# Patient Record
Sex: Male | Born: 1975 | Hispanic: No | Marital: Single
Health system: Southern US, Community
[De-identification: ages and names within clinical notes are randomized; demographics above are authoritative.]

---

## 2009-05-06 ENCOUNTER — Emergency Department (HOSPITAL_COMMUNITY): Admission: EM | Admit: 2009-05-06 | Discharge: 2009-05-06 | Payer: Self-pay | Admitting: Emergency Medicine

## 2009-08-29 ENCOUNTER — Emergency Department (HOSPITAL_COMMUNITY): Admission: EM | Admit: 2009-08-29 | Discharge: 2009-08-29 | Payer: Self-pay | Admitting: Emergency Medicine

## 2010-04-08 LAB — CBC
Hemoglobin: 14.4 g/dL (ref 13.0–17.0)
MCHC: 33.5 g/dL (ref 30.0–36.0)
RDW: 13.1 % (ref 11.5–15.5)

## 2010-04-08 LAB — DIFFERENTIAL
Basophils Relative: 1 % (ref 0–1)
Lymphocytes Relative: 13 % (ref 12–46)
Lymphs Abs: 1.2 10*3/uL (ref 0.7–4.0)
Monocytes Absolute: 0.6 10*3/uL (ref 0.1–1.0)
Monocytes Relative: 7 % (ref 3–12)
Neutrophils Relative %: 79 % — ABNORMAL HIGH (ref 43–77)

## 2010-04-08 LAB — BASIC METABOLIC PANEL
BUN: 20 mg/dL (ref 6–23)
CO2: 27 mEq/L (ref 19–32)
Calcium: 8.9 mg/dL (ref 8.4–10.5)
Chloride: 101 mEq/L (ref 96–112)
Creatinine, Ser: 1.02 mg/dL (ref 0.4–1.5)
GFR calc Af Amer: >60 mL/min (ref >60)
GFR calc non Af Amer: >60 mL/min (ref >60)
Glucose, Bld: 95 mg/dL (ref 70–99)
Sodium: 137 mEq/L (ref 135–145)

## 2010-04-08 LAB — URINALYSIS, ROUTINE W REFLEX MICROSCOPIC
Glucose, UA: NEGATIVE mg/dL
Urobilinogen, UA: 0.2 mg/dL (ref 0.0–1.0)
pH: 5.5 (ref 5.0–8.0)

## 2010-04-08 LAB — HEPATIC FUNCTION PANEL
AST: 32 U/L (ref 0–37)
Total Bilirubin: 1.5 mg/dL — ABNORMAL HIGH (ref 0.3–1.2)

## 2011-07-30 IMAGING — CT CT HEAD W/O CM
1 of 3 series · 15 of 30 positions shown, 19 images · non-contrast
Comparison: None.

CLINICAL DATA: Generalized weakness and weight loss for 2 months;
new onset of right zygomatic/parietal headache with photophobia.
Status post fall today.

CT HEAD WITHOUT CONTRAST
TECHNIQUE: Contiguous axial images were obtained from the base of
the skull through the vertex without contrast.

[Series 2: headseq 4.8 h45s · axial · 0.43mm/px · z∈[-142,-18]mm · 15 of 30 slices shown, 19 images]
[im 2/30  brain]
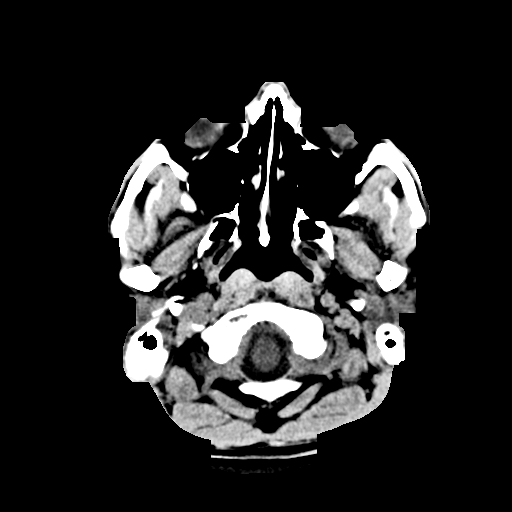
[im 2/30  bone]
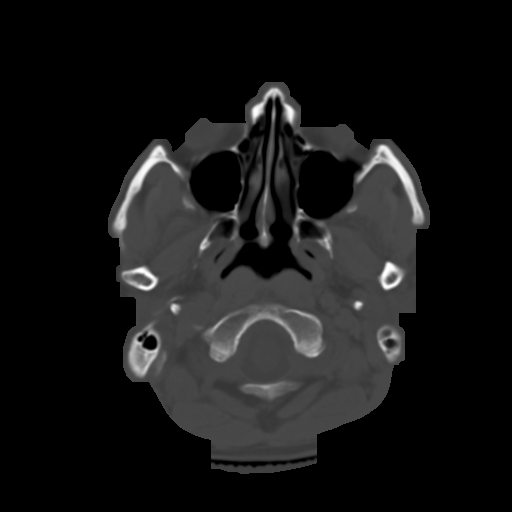
[im 4/30  brain]
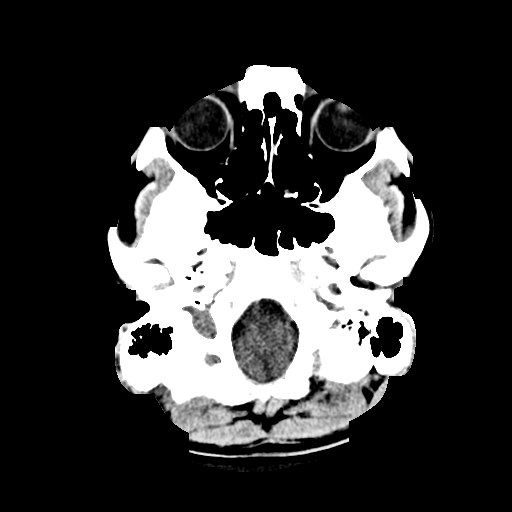
[im 6/30  brain]
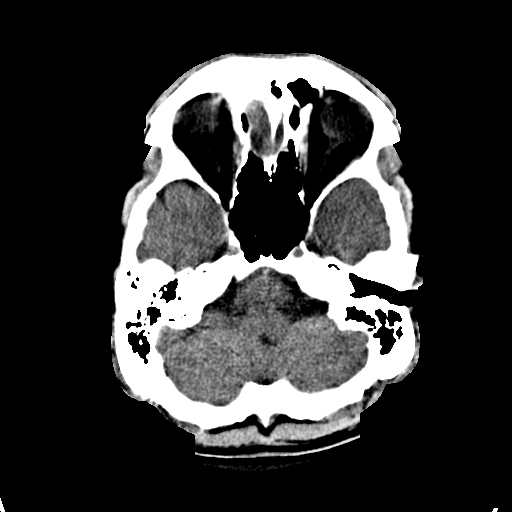
[im 7/30  brain]
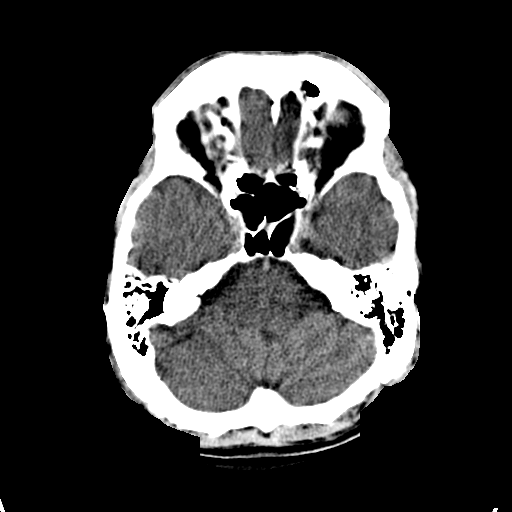
[im 9/30  brain]
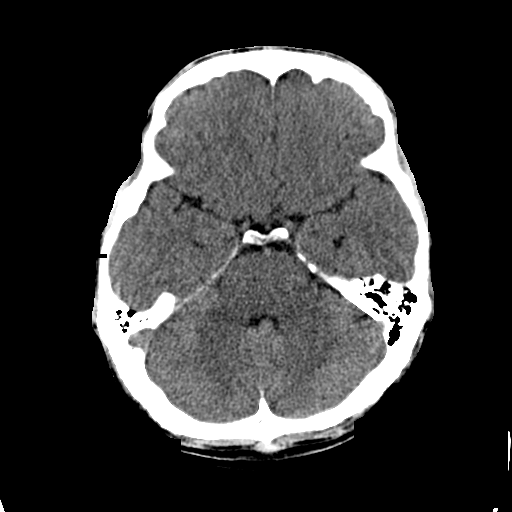
[im 9/30  bone]
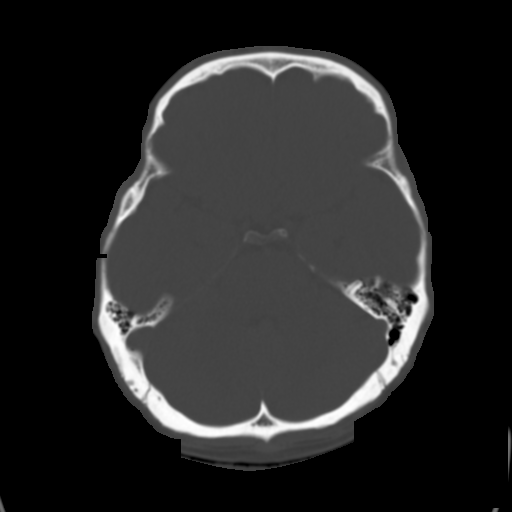
[im 11/30  brain]
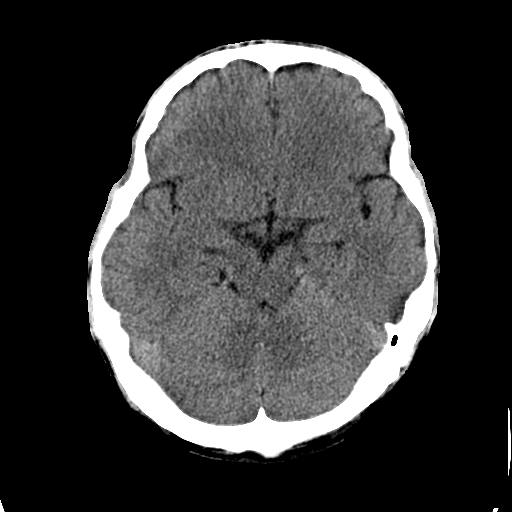
[im 12/30  brain]
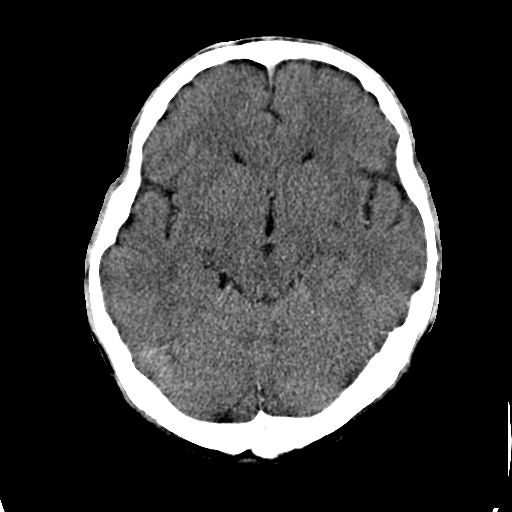
[im 16/30  brain]
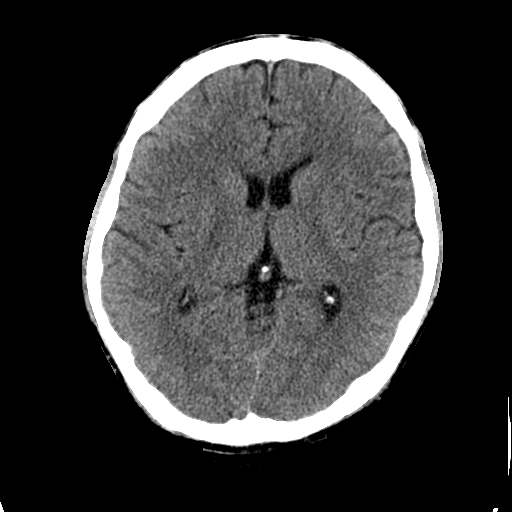
[im 18/30  brain]
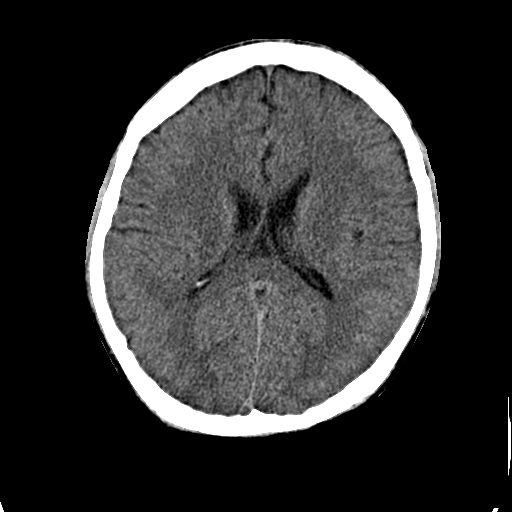
[im 18/30  bone]
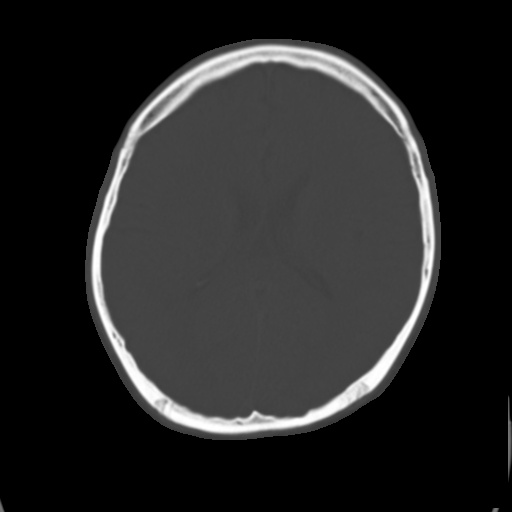
[im 19/30  brain]
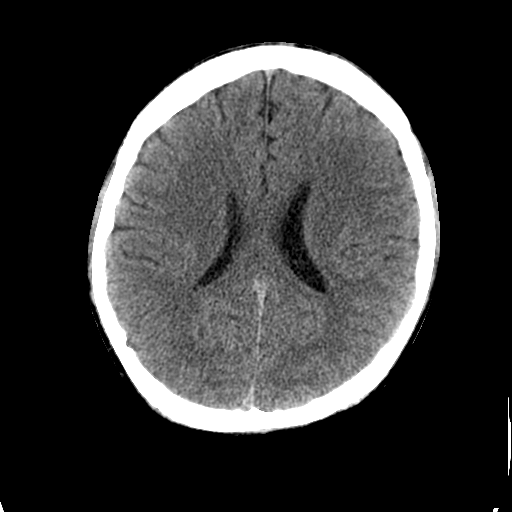
[im 21/30  brain]
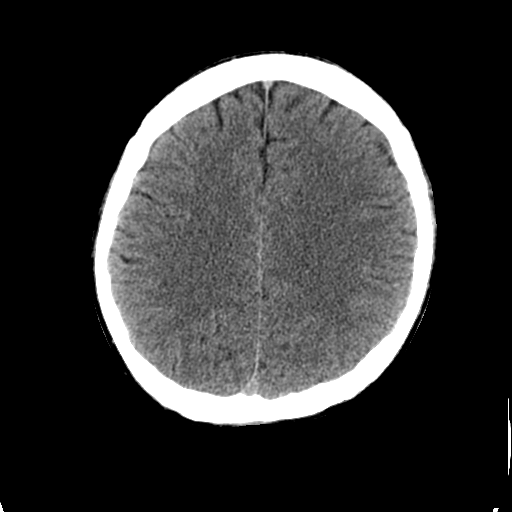
[im 23/30  brain]
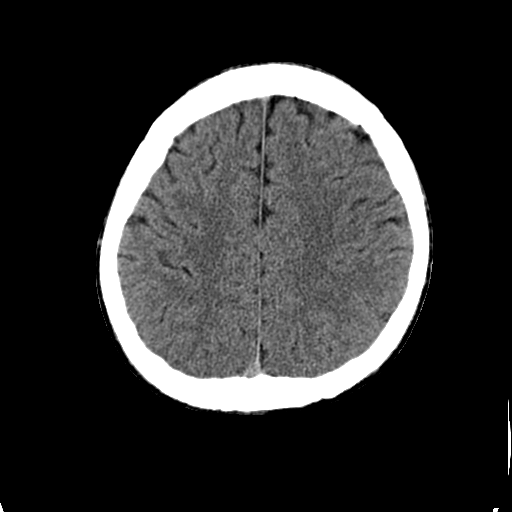
[im 24/30  brain]
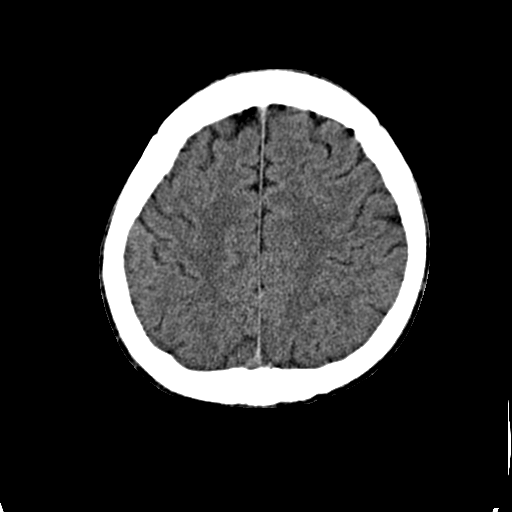
[im 24/30  bone]
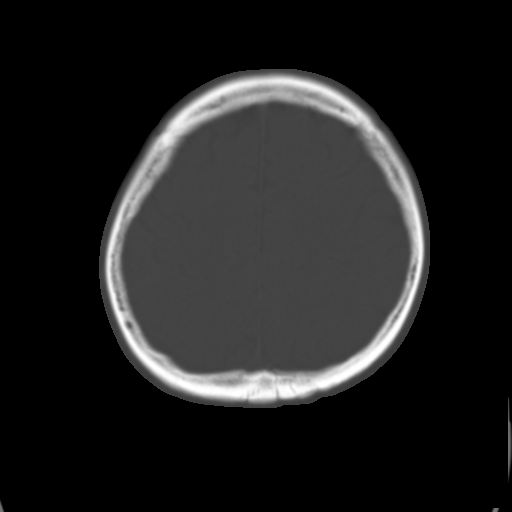
[im 26/30  brain]
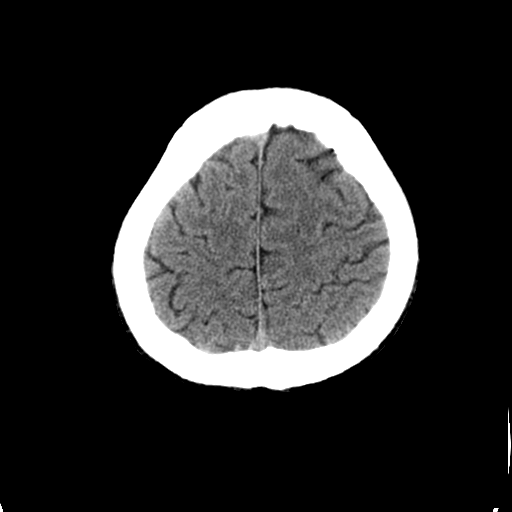
[im 28/30  brain]
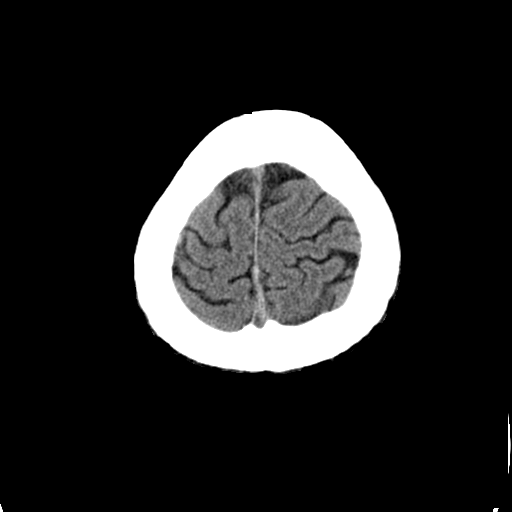

[15 of 30 positions shown; findings below may reference images not displayed]

FINDINGS: There is no evidence of acute infarction, mass lesion, or
intra- or extra-axial hemorrhage on CT.

The posterior fossa, including the cerebellum, brainstem and fourth
ventricle, is within normal limits.  The third and lateral
ventricles, and basal ganglia are unremarkable in appearance.  The
cerebral hemispheres are symmetric in appearance, with normal gray-
white differentiation.  No mass effect or midline shift is seen.

There is no evidence of fracture; visualized osseous structures are
unremarkable in appearance.  The visualized portions of the orbits
are within normal limits.  The paranasal sinuses and mastoid air
cells are well-aerated.  There is hypoplasia of the frontal
sinuses.  No significant soft tissue abnormalities are seen.
IMPRESSION: No evidence of traumatic intracranial injury or fracture.

## 2021-08-27 ENCOUNTER — Emergency Department (HOSPITAL_COMMUNITY): Payer: 59

## 2021-08-27 ENCOUNTER — Encounter (HOSPITAL_COMMUNITY): Payer: Self-pay | Admitting: Emergency Medicine

## 2021-08-27 ENCOUNTER — Emergency Department (HOSPITAL_COMMUNITY)
Admission: EM | Admit: 2021-08-27 | Discharge: 2021-08-27 | Disposition: A | Discharge status: Home / Self Care | Payer: 59 | Attending: Emergency Medicine | Admitting: Emergency Medicine

## 2021-08-27 DIAGNOSIS — R519 Headache, unspecified: Secondary | ICD-10-CM | POA: Diagnosis present

## 2021-08-27 LAB — CBC
HCT: 47 % (ref 39.0–52.0)
Hemoglobin: 15.4 g/dL (ref 13.0–17.0)
MCH: 30.9 pg (ref 26.0–34.0)
MCHC: 32.8 g/dL (ref 30.0–36.0)
MCV: 94.2 fL (ref 80.0–100.0)
Platelets: 282 10*3/uL (ref 150–400)
RBC: 4.99 MIL/uL (ref 4.22–5.81)
RDW: 12.8 % (ref 11.5–15.5)
WBC: 5.1 10*3/uL (ref 4.0–10.5)
nRBC: 0 % (ref 0.0–0.2)

## 2021-08-27 LAB — BASIC METABOLIC PANEL
Anion gap: 8 (ref 5–15)
BUN: 12 mg/dL (ref 6–20)
CO2: 22 mmol/L (ref 22–32)
Calcium: 8.9 mg/dL (ref 8.9–10.3)
Chloride: 106 mmol/L (ref 98–111)
Creatinine, Ser: 0.83 mg/dL (ref 0.61–1.24)
GFR, Estimated: >60 mL/min (ref >60)
Glucose, Bld: 93 mg/dL (ref 70–99)
Potassium: 3.7 mmol/L (ref 3.5–5.1)
Sodium: 136 mmol/L (ref 135–145)

## 2021-08-27 NOTE — ED Triage Notes (Signed)
Pt endorses pain to right back side of head since 5 am. No relief from tylenol. Ambulatory to triage, no facial droop.

## 2021-08-27 NOTE — Discharge Instructions (Signed)
S,Please use Tylenol or ibuprofen for pain.  You may use 600 mg ibuprofen every 6 hours or 1000 mg of Tylenol every 6 hours.  You may choose to alternate between the 2.  This would be most effective.  Not to exceed 4 g of Tylenol within 24 hours.  Not to exceed 3200 mg ibuprofen 24 hours.  Please continue to drink plenty of fluids, and monitor symptoms, if you begin to have vision changes, double vision, nausea, vomiting, weakness, dizziness, tingling please return for further evaluation.

## 2021-08-27 NOTE — ED Notes (Signed)
Patient given discharge instructions, all questions answered. Patient in possession of all belongings, directed to the discharge area  

## 2021-08-27 NOTE — ED Provider Triage Note (Signed)
Emergency Medicine Provider Triage Evaluation Note  Javier Lewis , a 46 y.o. male  was evaluated in triage.  Pt complains of headache that started suddenly this morning. Endorses largely on right side of head, started spontaneously. Minimal relief with tylenol. Denies photophobia, nausea, vomiting, weakness, numbness, does endorse mild dizziness.  Review of Systems  Positive: headache Negative: Photophobia, NV  Physical Exam  BP 130/77 (BP Location: Left Arm)   Pulse 85   Temp 97.6 F (36.4 C) (Oral)   Resp 16   Ht 5\' 3"  (1.6 m)   Wt 49.9 kg   SpO2 100%   BMI 19.49 kg/m  Gen:   Awake, no distress   Resp:  Normal effort  MSK:   Moves extremities without difficulty  Other:  Moves all limbs spontaneously, CN2-12 grossly intact  Medical Decision Making  Medically screening exam initiated at 12:08 PM.  Appropriate orders placed.  Javier Lewis was informed that the remainder of the evaluation will be completed by another provider, this initial triage assessment does not replace that evaluation, and the importance of remaining in the ED until their evaluation is complete.  Workup initiated   Arlana Pouch, PA-C 08/27/21 1210

## 2021-08-27 NOTE — ED Provider Notes (Signed)
[facility]-EMERGENCY DEPT Provider Note   CSN: [id number] Arrival date & time: 08/27/21  1151     History  Chief Complaint  Patient presents with   Headache    Javier Lewis is a 46 y.o. male Pt complains of headache that started suddenly this morning. Endorses largely on right side of head, started spontaneously. Minimal relief with tylenol. Denies photophobia, nausea, vomiting, weakness, numbness, does endorse mild dizziness.  Reports never experienced something like this before.  No history of tension type headaches, migraines, cluster headaches.  Patient reports that he is normally healthy, runs every day, works out.  Patient reports that he was scared due to the severity of onset of the headache.   Headache      Home Medications Prior to Admission medications   Not on File      Allergies    Patient has no allergy information on record.    Review of Systems   Review of Systems  Neurological:  Positive for headaches.  All other systems reviewed and are negative.   Physical Exam Updated Vital Signs BP 130/77 (BP Location: Left Arm)   Pulse 85   Temp 97.6 F (36.4 C) (Oral)   Resp 16   Ht 5\' 3"  (1.6 m)   Wt 49.9 kg   SpO2 100%   BMI 19.49 kg/m  Physical Exam Vitals and nursing note reviewed.  Constitutional:      General: He is not in acute distress.    Appearance: Normal appearance.  HENT:     Head: Normocephalic and atraumatic.  Eyes:     General:        Right eye: No discharge.        Left eye: No discharge.  Cardiovascular:     Rate and Rhythm: Normal rate and regular rhythm.     Heart sounds: No murmur heard.    No friction rub. No gallop.  Pulmonary:     Effort: Pulmonary effort is normal.     Breath sounds: Normal breath sounds.  Abdominal:     General: Bowel sounds are normal.     Palpations: Abdomen is soft.  Skin:    General: Skin is warm and dry.     Capillary Refill: Capillary refill takes less than 2 seconds.   Neurological:     Mental Status: He is alert and oriented to person, place, and time.     Comments: Cranial nerves II through XII grossly intact.  Intact finger-nose, intact heel-to-shin.  Romberg negative, gait normal.  Alert and oriented x3.  Moves all 4 limbs spontaneously, normal coordination.  No pronator drift.  Intact strength 5 out of 5 bilateral upper and lower extremities.    Psychiatric:        Mood and Affect: Mood normal.        Behavior: Behavior normal.     ED Results / Procedures / Treatments   Labs (all labs ordered are listed, but only abnormal results are displayed) Labs Reviewed  CBC  BASIC METABOLIC PANEL    EKG None  Radiology CT Head Wo Contrast  Result Date: 08/27/2021 CLINICAL DATA:  Sudden onset headache. EXAM: CT HEAD WITHOUT CONTRAST TECHNIQUE: Contiguous axial images were obtained from the base of the skull through the vertex without intravenous contrast. RADIATION DOSE REDUCTION: This exam was performed according to the departmental dose-optimization program which includes automated exposure control, adjustment of the mA and/or kV according to patient size and/or use of iterative reconstruction technique.  COMPARISON:  08/29/2009 FINDINGS: Brain: There is no evidence for acute hemorrhage, hydrocephalus, mass lesion, or abnormal extra-axial fluid collection. No definite CT evidence for acute infarction. Vascular: No hyperdense vessel or unexpected calcification. Skull: No evidence for fracture. No worrisome lytic or sclerotic lesion. Sinuses/Orbits: The visualized paranasal sinuses and mastoid air cells are clear. Visualized portions of the globes and intraorbital fat are unremarkable. Other: None. IMPRESSION: No acute intracranial abnormality. Electronically Signed   By: Kennith Center M.D.   On: 08/27/2021 12:42    Procedures Procedures    Medications Ordered in ED Medications - No data to display  ED Course/ Medical Decision Making/ A&P                            Medical Decision Making Amount and/or Complexity of Data Reviewed Labs: ordered. Radiology: ordered.   This patient is a 46 y.o. male who presents to the ED for concern of sudden onset right-sided headache, this involves an extensive number of treatment options, and is a complaint that carries with it a high risk of complications and morbidity. The emergent differential diagnosis prior to evaluation includes, but is not limited to, atypical stroke manifestation, subarachnoid hemorrhage, migraine, ocular migraine, cluster headache, and lateral tension type headache,.   This is not an exhaustive differential.   Past Medical History / Co-morbidities / Social History: Noncontributory  Additional history: Chart reviewed. Pertinent results include: No previous past medical history on file  Physical Exam: Physical exam performed. The pertinent findings include: No focal neurologic deficits on exam, negative Romberg, normal gait.  No evidence of nystagmus or peripheral cause of vertigo.  Lab Tests: I ordered, and personally interpreted labs.  The pertinent results include: CBC and BMP are unremarkable   Imaging Studies: I ordered imaging studies including CT head without contrast. I independently visualized and interpreted imaging which showed no intracranial abnormality. I agree with the radiologist interpretation.  Disposition: After consideration of the diagnostic results and the patients response to treatment, I feel that patient's symptoms have resolved/improved over time with watchful waiting.  Encouraged continued ibuprofen, Tylenol, plain fluids, and follow-up as needed with any evolution of symptoms.  At this time I have low clinical suspicion for acute intracranial abnormality, suspicious of migraine versus cluster headache versus tension type headache which is not intractable..   emergency department workup does not suggest an emergent condition requiring admission or  immediate intervention beyond what has been performed at this time. The plan is: As above. The patient is safe for discharge and has been instructed to return immediately for worsening symptoms, change in symptoms or any other concerns.  I discussed this case with my attending physician Dr. Durwin Nora who cosigned this note including patient's presenting symptoms, physical exam, and planned diagnostics and interventions. Attending physician stated agreement with plan or made changes to plan which were implemented.    Final Clinical Impression(s) / ED Diagnoses Final diagnoses:  Acute nonintractable headache, unspecified headache type    Rx / DC Orders ED Discharge Orders     None         [place] 08/27/21 1354    Gloris Manchester, MD 08/29/21 1820
